# Patient Record
Sex: Female | Born: 1990 | Race: White | Hispanic: No | Marital: Married | Smoking: Never smoker
Health system: Southern US, Community
[De-identification: ages and names within clinical notes are randomized; demographics above are authoritative.]

---

## 2015-12-27 ENCOUNTER — Emergency Department
Admission: EM | Admit: 2015-12-27 | Discharge: 2015-12-27 | Disposition: A | Discharge status: Home / Self Care | Payer: PRIVATE HEALTH INSURANCE | Source: Home / Self Care | Attending: Family Medicine | Admitting: Family Medicine

## 2015-12-27 ENCOUNTER — Encounter: Payer: Self-pay | Admitting: Emergency Medicine

## 2015-12-27 DIAGNOSIS — H04301 Unspecified dacryocystitis of right lacrimal passage: Secondary | ICD-10-CM

## 2015-12-27 MED ORDER — SULFACETAMIDE SODIUM 10 % OP SOLN: 1.0000 [drp] | OPHTHALMIC | 0 refills | Status: DC

## 2015-12-27 NOTE — Discharge Instructions (Signed)
Begin applying warm compress to right eye several times daily.

## 2015-12-27 NOTE — ED Provider Notes (Signed)
[facility]    CSN: [id number] Arrival date & time: 12/27/15  1446     History   Chief Complaint Chief Complaint  Patient presents with  . Eye Drainage    HPI Tiffany Moore is a 25 y.o. female.   Patient complains of onset of right eye drainage this morning.  No foreign body sensation.    The history is provided by the patient.  Eye Problem  Location:  Right eye Quality:  Aching Severity:  Mild Onset quality:  Sudden Duration:  6 hours Timing:  Constant Progression:  Unchanged Chronicity:  New Context: not burn, not chemical exposure, not contact lens problem, not direct trauma, not foreign body, not scratch, not smoke exposure and not UV exposure   Relieved by:  Nothing Worsened by:  Nothing Ineffective treatments:  None tried Associated symptoms: crusting, discharge, inflammation, redness, tearing and tingling   Associated symptoms: no blurred vision, no decreased vision, no double vision, no facial rash, no headaches, no itching, no nausea, no photophobia, no scotomas and no swelling     History reviewed. No pertinent past medical history.  There are no active problems to display for this patient.   History reviewed. No pertinent surgical history.  OB History    No data available       Home Medications    Prior to Admission medications   Medication Sig Start Date End Date Taking? Authorizing Provider  sulfacetamide (BLEPH-10) 10 % ophthalmic solution Place 1-2 drops into the right eye every 3 (three) hours. (while awake) 12/27/15   Lattie HawStephen A Beese, MD    Family History No family history on file.  Social History Social History  Substance Use Topics  . Smoking status: Never Smoker  . Smokeless tobacco: Never Used  . Alcohol use No     Allergies   Review of patient's allergies indicates no known allergies.   Review of Systems Review of Systems  Eyes: Positive for discharge and redness. Negative for blurred vision, double  vision, photophobia and itching.  Gastrointestinal: Negative for nausea.  Neurological: Positive for tingling. Negative for headaches.  All other systems reviewed and are negative.    Physical Exam Triage Vital Signs ED Triage Vitals  Enc Vitals Group     BP 12/27/15 1518 122/79     Pulse Rate 12/27/15 1518 75     Resp --      Temp 12/27/15 1518 98.4 F (36.9 C)     Temp Source 12/27/15 1518 Oral     SpO2 12/27/15 1518 98 %     Weight 12/27/15 1519 166 lb 12 oz (75.6 kg)     Height 12/27/15 1519 5\' 6"  (1.676 m)     Head Circumference --      Peak Flow --      Pain Score 12/27/15 1520 5     Pain Loc --      Pain Edu? --      Excl. in GC? --    No data found.   Updated Vital Signs BP 122/79 (BP Location: Left Arm)   Pulse 75   Temp 98.4 F (36.9 C) (Oral)   Ht 5\' 6"  (1.676 m)   Wt 166 lb 12 oz (75.6 kg)   SpO2 98%   BMI 26.91 kg/m   Visual Acuity Right Eye Distance:   Left Eye Distance:   Bilateral Distance:    Right Eye Near:   Left Eye Near:    Bilateral Near:  Physical Exam  Constitutional: She appears well-developed and well-nourished. No distress.  HENT:  Head: Normocephalic.  Right Ear: Tympanic membrane, external ear and ear canal normal.  Left Ear: Tympanic membrane, external ear and ear canal normal.  Nose: Nose normal.  Mouth/Throat: Oropharynx is clear and moist.  Eyes: Conjunctivae, EOM and lids are normal. Pupils are equal, round, and reactive to light. Lids are everted and swept, no foreign bodies found. Right eye exhibits discharge. Right eye exhibits no hordeolum.    Right eye has mild swelling and tenderness around the nasolacrimal duct, with small amount of mucopurulent drainage present.  No photophobia.  No lid swelling or tenderness.  Neck: Neck supple.  Lymphadenopathy:    She has no cervical adenopathy.  Nursing note and vitals reviewed.    UC Treatments / Results  Labs (all labs ordered are listed, but only abnormal  results are displayed) Labs Reviewed - No data to display  EKG  EKG Interpretation None       Radiology No results found.  Procedures Procedures (including critical care time)  Medications Ordered in UC Medications - No data to display   Initial Impression / Assessment and Plan / UC Course  I have reviewed the triage vital signs and the nursing notes.  Pertinent labs & imaging results that were available during my care of the patient were reviewed by me and considered in my medical decision making (see chart for details).  Clinical Course  Begin sulfacetamide ophth suspension.  Begin applying warm compress to right eye several times daily. Followup with ophthalmologist if not improved 4 days.    Final Clinical Impressions(s) / UC Diagnoses   Final diagnoses:  Dacrocystitis, right    New Prescriptions New Prescriptions   SULFACETAMIDE (BLEPH-10) 10 % OPHTHALMIC SOLUTION    Place 1-2 drops into the right eye every 3 (three) hours. (while awake)     Lattie HawStephen A Beese, MD 01/03/16 2206

## 2015-12-27 NOTE — ED Triage Notes (Signed)
Pt c/o possible pink eye, drainage this morning, some pain.

## 2017-01-20 ENCOUNTER — Emergency Department
Admission: EM | Admit: 2017-01-20 | Discharge: 2017-01-20 | Disposition: A | Discharge status: Home / Self Care | Payer: PRIVATE HEALTH INSURANCE | Source: Home / Self Care | Attending: Family Medicine | Admitting: Family Medicine

## 2017-01-20 ENCOUNTER — Encounter: Payer: Self-pay | Admitting: Emergency Medicine

## 2017-01-20 ENCOUNTER — Other Ambulatory Visit: Payer: Self-pay

## 2017-01-20 DIAGNOSIS — J069 Acute upper respiratory infection, unspecified: Secondary | ICD-10-CM

## 2017-01-20 DIAGNOSIS — R0981 Nasal congestion: Secondary | ICD-10-CM

## 2017-01-20 DIAGNOSIS — H9203 Otalgia, bilateral: Secondary | ICD-10-CM

## 2017-01-20 LAB — POCT RAPID STREP A (OFFICE): Rapid Strep A Screen: NEGATIVE

## 2017-01-20 MED ORDER — AZITHROMYCIN 250 MG PO TABS: 250.0000 mg | ORAL_TABLET | Freq: Every day | ORAL | 0 refills | Status: DC

## 2017-01-20 MED ORDER — BENZONATATE 100 MG PO CAPS: 100.0000 mg | ORAL_CAPSULE | Freq: Three times a day (TID) | ORAL | 0 refills | Status: DC

## 2017-01-20 NOTE — Discharge Instructions (Signed)
°  Your symptoms are likely due to a virus such as the common cold, however, if you developing worsening chest congestion with shortness of breath, persistent fever (>100.4*F) for 3 days, or symptoms not improving in 4-5 days, you may fill the antibiotic (azithromycin).  If you do fill the antibiotic,  please take antibiotics as prescribed and be sure to complete entire course even if you start to feel better to ensure infection does not come back. ° °You may take 500mg acetaminophen every 4-6 hours or in combination with ibuprofen 400-600mg every 6-8 hours as needed for pain, inflammation, and fever. ° °Be sure to drink at least eight 8oz glasses of water to stay well hydrated and get at least 8 hours of sleep at night, preferably more while sick.  ° ° °

## 2017-01-20 NOTE — ED Triage Notes (Signed)
Reports sore throat, ear pain, headache and congestion over past week; denies fever. No OTCs this morning. Had frequent strep throat as child.

## 2017-01-20 NOTE — ED Provider Notes (Signed)
[facility]    CSN: [id number] Arrival date & time: 01/20/17  0836     History   Chief Complaint Chief Complaint  Patient presents with  . Sore Throat  . Otalgia  . Headache  . Nasal Congestion    HPI Tiffany Moore is a 26 y.o. female.   HPI Tiffany Moore is a 26 y.o. female presenting to UC with c/o 6-7 days of gradually worsening sore throat, bilateral ear pain, congestion, and mild cough.  She has tried OTC allergy medication w/o relief. 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  Denies fever, chills, n/v/d. She does report hx of frequent strep throat as a child. She has been able to keep down fluids.   History reviewed. No pertinent past medical history.  There are no active problems to display for this patient.   History reviewed. No pertinent surgical history.  OB History    No data available       Home Medications    Prior to Admission medications   Medication Sig Start Date End Date Taking? Authorizing Provider  levonorgestrel-ethinyl estradiol (ENPRESSE,TRIVORA) tablet Take 1 tablet by mouth daily.   Yes [provider]  azithromycin (ZITHROMAX) 250 MG tablet Take 1 tablet (250 mg total) by mouth daily. Take first 2 tablets together, then 1 every day until finished. 01/20/17   Lurene ShadowPhelps, Sophiarose Eades O, PA-C  benzonatate (TESSALON) 100 MG capsule Take 1-2 capsules (100-200 mg total) by mouth every 8 (eight) hours. 01/20/17   Lurene ShadowPhelps, Travaris Kosh O, PA-C  sulfacetamide (BLEPH-10) 10 % ophthalmic solution Place 1-2 drops into the right eye every 3 (three) hours. (while awake) 12/27/15   Lattie HawBeese, Stephen A, MD    Family History No family history on file.  Social History Social History   Tobacco Use  . Smoking status: Never Smoker  . Smokeless tobacco: Never Used  Substance Use Topics  . Alcohol use: No  . Drug use: Not on file     Allergies   Patient has no known allergies.   Review of Systems Review of  Systems  Constitutional: Negative for chills and fever.  HENT: Positive for congestion, ear pain, postnasal drip, rhinorrhea and sore throat. Negative for trouble swallowing and voice change.   Respiratory: Positive for cough. Negative for shortness of breath.   Cardiovascular: Negative for chest pain and palpitations.  Gastrointestinal: Negative for abdominal pain, diarrhea, nausea and vomiting.  Musculoskeletal: Negative for arthralgias, back pain and myalgias.  Skin: Negative for rash.  Neurological: Positive for headaches. Negative for dizziness and light-headedness.     Physical Exam Triage Vital Signs ED Triage Vitals  Enc Vitals Group     BP 01/20/17 0915 112/76     Pulse Rate 01/20/17 0915 88     Resp 01/20/17 0915 16     Temp 01/20/17 0915 98.5 F (36.9 C)     Temp Source 01/20/17 0915 Oral     SpO2 01/20/17 0915 100 %     Weight 01/20/17 0916 177 lb (80.3 kg)     Height 01/20/17 0916 5\' 6"  (1.676 m)     Head Circumference --      Peak Flow --      Pain Score --      Pain Loc --      Pain Edu? --      Excl. in GC? --    No data found.  Updated Vital Signs BP 112/76 (BP Location:  Right Arm)   Pulse 88   Temp 98.5 F (36.9 C) (Oral)   Resp 16   Ht 5\' 6"  (1.676 m)   Wt 177 lb (80.3 kg)   LMP 01/13/2017   SpO2 100%   BMI 28.57 kg/m   Visual Acuity Right Eye Distance:   Left Eye Distance:   Bilateral Distance:    Right Eye Near:   Left Eye Near:    Bilateral Near:     Physical Exam  Constitutional: She is oriented to person, place, and time. She appears well-developed and well-nourished.  Non-toxic appearance. She does not appear ill. No distress.  HENT:  Head: Normocephalic and atraumatic.  Right Ear: Tympanic membrane normal.  Left Ear: Tympanic membrane normal.  Nose: Mucosal edema present. Right sinus exhibits no maxillary sinus tenderness and no frontal sinus tenderness. Left sinus exhibits no maxillary sinus tenderness and no frontal sinus  tenderness.  Mouth/Throat: Uvula is midline and mucous membranes are normal. Posterior oropharyngeal erythema present. No oropharyngeal exudate, posterior oropharyngeal edema or tonsillar abscesses.  Eyes: EOM are normal.  Neck: Normal range of motion. Neck supple.  Cardiovascular: Normal rate and regular rhythm.  Pulmonary/Chest: Effort normal and breath sounds normal. No stridor. She has no wheezes. She has no rhonchi.  Musculoskeletal: Normal range of motion.  Lymphadenopathy:    She has no cervical adenopathy.  Neurological: She is alert and oriented to person, place, and time.  Skin: Skin is warm and dry.  Psychiatric: She has a normal mood and affect. Her behavior is normal.  Nursing note and vitals reviewed.    UC Treatments / Results  Labs (all labs ordered are listed, but only abnormal results are displayed) Labs Reviewed  POCT RAPID STREP A (OFFICE)    EKG  EKG Interpretation None       Radiology No results found.  Procedures Procedures (including critical care time)  Medications Ordered in UC Medications - No data to display   Initial Impression / Assessment and Plan / UC Course  I have reviewed the triage vital signs and the nursing notes.  Pertinent labs & imaging results that were available during my care of the patient were reviewed by me and considered in my medical decision making (see chart for details).     Hx and exam c/w viral illness Encouraged symptomatic treatment Prescription to hold with expiration date for azithromycin. Pt to fill if persistent fever develops or not improving in 1 week.    Final Clinical Impressions(s) / UC Diagnoses   Final diagnoses:  Upper respiratory tract infection, unspecified type  Acute otalgia, bilateral  Sinus congestion    ED Discharge Orders        Ordered    benzonatate (TESSALON) 100 MG capsule  Every 8 hours     01/20/17 0927    azithromycin (ZITHROMAX) 250 MG tablet  Daily    Comments:  Void  after 02/06/17   01/20/17 [id number]       Controlled Substance Prescriptions [place] Controlled Substance Registry consulted? Not Applicable   Rolla Platehelps, Anastazia [place] O, PA-C 01/20/17 [id number]

## 2017-01-22 ENCOUNTER — Telehealth: Payer: Self-pay | Admitting: Emergency Medicine

## 2017-01-22 NOTE — Telephone Encounter (Signed)
Courtesy call to patient.Patient feels well and advised to CB with questions or concerns.

## 2017-04-04 ENCOUNTER — Emergency Department
Admission: EM | Admit: 2017-04-04 | Discharge: 2017-04-04 | Disposition: A | Discharge status: Home / Self Care | Payer: PRIVATE HEALTH INSURANCE | Source: Home / Self Care | Attending: Family Medicine | Admitting: Family Medicine

## 2017-04-04 ENCOUNTER — Other Ambulatory Visit: Payer: Self-pay

## 2017-04-04 ENCOUNTER — Telehealth: Payer: Self-pay | Admitting: *Deleted

## 2017-04-04 DIAGNOSIS — J069 Acute upper respiratory infection, unspecified: Secondary | ICD-10-CM | POA: Diagnosis not present

## 2017-04-04 MED ORDER — PREDNISONE 10 MG PO TABS: 30.0000 mg | ORAL_TABLET | Freq: Every day | ORAL | 0 refills | Status: AC

## 2017-04-04 MED ORDER — PROMETHAZINE-PHENYLEPHRINE 6.25-5 MG/5ML PO SYRP: 5.0000 mL | ORAL_SOLUTION | Freq: Four times a day (QID) | ORAL | 0 refills | Status: DC | PRN

## 2017-04-04 MED ORDER — AMOXICILLIN 500 MG PO CAPS: 500.0000 mg | ORAL_CAPSULE | Freq: Three times a day (TID) | ORAL | 0 refills | Status: DC

## 2017-04-04 MED ORDER — PREDNISOLONE SODIUM PHOSPHATE 10 MG PO TBDP: 30.0000 mg | ORAL_TABLET | Freq: Every day | ORAL | 0 refills | Status: AC

## 2017-04-04 NOTE — ED Provider Notes (Signed)
[facility]    CSN: [id number] Arrival date & time: 04/04/17  0850     History   Chief Complaint Chief Complaint  Patient presents with  . Cough    HPI Litzi Binning is a 27 y.o. female.   HPI Kaniesha Barile is a 27 y.o. female presenting to UC with c/o 2 weeks worsening mildly productive cough for 2 weeks with associated SOB, chest tightness and coughing fits that have caused post-tussive vomiting twice this morning.  She is also c/o upper abdominal pain from cough.  She has taken multiple OTC cough medications w/o relief. 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 Denies fever, chills, n/v/d.  Denies hx of asthma.   History reviewed. No pertinent past medical history.  There are no active problems to display for this patient.   History reviewed. No pertinent surgical history.  OB History    No data available       Home Medications    Prior to Admission medications   Medication Sig Start Date End Date Taking? Authorizing Provider  amoxicillin (AMOXIL) 500 MG capsule Take 1 capsule (500 mg total) by mouth 3 (three) times daily. 04/04/17   Lurene Shadow, PA-C  azithromycin (ZITHROMAX) 250 MG tablet Take 1 tablet (250 mg total) by mouth daily. Take first 2 tablets together, then 1 every day until finished. 01/20/17   Lurene Shadow, PA-C  benzonatate (TESSALON) 100 MG capsule Take 1-2 capsules (100-200 mg total) by mouth every 8 (eight) hours. 01/20/17   Lurene Shadow, PA-C  levonorgestrel-ethinyl estradiol Geanie Logan) tablet Take 1 tablet by mouth daily.    [provider]  prednisoLONE (ORAPRED ODT) 10 MG disintegrating tablet Take 3 tablets (30 mg total) by mouth daily for 5 days. 04/04/17 04/09/17  Lurene Shadow, PA-C  promethazine-phenylephrine (PROMETHAZINE VC) 6.25-5 MG/5ML SYRP Take 5 mLs by mouth every 6 (six) hours as needed for congestion. 04/04/17   Lurene Shadow, PA-C  sulfacetamide (BLEPH-10) 10 % ophthalmic  solution Place 1-2 drops into the right eye every 3 (three) hours. (while awake) 12/27/15   Lattie Haw, MD    Family History History reviewed. No pertinent family history.  Social History Social History   Tobacco Use  . Smoking status: Never Smoker  . Smokeless tobacco: Never Used  Substance Use Topics  . Alcohol use: No  . Drug use: Not on file     Allergies   Patient has no known allergies.   Review of Systems Review of Systems  Constitutional: Negative for chills and fever.  HENT: Positive for congestion and postnasal drip. Negative for ear pain, sore throat, trouble swallowing and voice change.   Respiratory: Positive for cough, chest tightness and shortness of breath.   Cardiovascular: Positive for chest pain. Negative for palpitations.  Gastrointestinal: Positive for vomiting (post-tussive). Negative for abdominal pain, diarrhea and nausea.  Musculoskeletal: Negative for arthralgias, back pain and myalgias.  Skin: Negative for rash.  Neurological: Positive for headaches. Negative for dizziness and light-headedness.     Physical Exam Triage Vital Signs ED Triage Vitals  Enc Vitals Group     BP 04/04/17 0941 130/80     Pulse Rate 04/04/17 0941 91     Resp --      Temp 04/04/17 0941 98.2 F (36.8 C)     Temp Source 04/04/17 0941 Oral     SpO2 04/04/17 0941 100 %     Weight 04/04/17 0942 177  lb (80.3 kg)     Height 04/04/17 0942 5\' 6"  (1.676 m)     Head Circumference --      Peak Flow --      Pain Score 04/04/17 0942 0     Pain Loc --      Pain Edu? --      Excl. in GC? --    No data found.  Updated Vital Signs BP 130/80 (BP Location: Right Arm)   Pulse 91   Temp 98.2 F (36.8 C) (Oral)   Ht 5\' 6"  (1.676 m)   Wt 177 lb (80.3 kg)   LMP 03/08/2017   SpO2 100%   BMI 28.57 kg/m   Visual Acuity Right Eye Distance:   Left Eye Distance:   Bilateral Distance:    Right Eye Near:   Left Eye Near:    Bilateral Near:     Physical Exam    Constitutional: She is oriented to person, place, and time. She appears well-developed and well-nourished. No distress.  HENT:  Head: Normocephalic and atraumatic.  Right Ear: Tympanic membrane normal.  Left Ear: Tympanic membrane normal.  Nose: Nose normal.  Mouth/Throat: Uvula is midline, oropharynx is clear and moist and mucous membranes are normal.  Eyes: EOM are normal.  Neck: Normal range of motion.  Cardiovascular: Normal rate and regular rhythm.  Pulmonary/Chest: Effort normal. No stridor. No respiratory distress. She has wheezes (faint expiratory ). She has no rales.  Cough brought on by deep inspiration during lung exam.   Musculoskeletal: Normal range of motion.  Neurological: She is alert and oriented to person, place, and time.  Skin: Skin is warm and dry. She is not diaphoretic.  Psychiatric: She has a normal mood and affect. Her behavior is normal.  Nursing note and vitals reviewed.    UC Treatments / Results  Labs (all labs ordered are listed, but only abnormal results are displayed) Labs Reviewed - No data to display  EKG  EKG Interpretation None       Radiology No results found.  Procedures Procedures (including critical care time)  Medications Ordered in UC Medications - No data to display   Initial Impression / Assessment and Plan / UC Course  I have reviewed the triage vital signs and the nursing notes.  Pertinent labs & imaging results that were available during my care of the patient were reviewed by me and considered in my medical decision making (see chart for details).     Will cover for underlying bacterial infection given duration of worsening symptoms. Pt reports GI upset with azithromycin but has done well with amoxicillin F/u with PCP in 1 week if not improving.   Final Clinical Impressions(s) / UC Diagnoses   Final diagnoses:  Upper respiratory tract infection, unspecified type    ED Discharge Orders        Ordered     prednisoLONE (ORAPRED ODT) 10 MG disintegrating tablet  Daily     04/04/17 0948    amoxicillin (AMOXIL) 500 MG capsule  3 times daily     04/04/17 0948    promethazine-phenylephrine (PROMETHAZINE VC) 6.25-5 MG/5ML SYRP  Every 6 hours PRN     04/04/17 0948       Controlled Substance Prescriptions [place] Controlled Substance Registry consulted? Not Applicable   Rolla Platehelps, Daviyon Widmayer O, PA-C 04/04/17 [id number]

## 2017-04-04 NOTE — ED Triage Notes (Signed)
Cough over 2 weeks, SOB, coughing fits that caused vomiting this morning.  Having upper abd. Pain this am from coughing, painful to take a deep breath

## 2017-04-04 NOTE — Discharge Instructions (Signed)
°  Promethazine-codeine is a strong narcotic cough medication.  Be sure to take with large glass of water.  It may cause drowsiness. Do not drive, drink alcohol or take other sedating medications such as Nyquil while taking this medication.  This medication may cause stomach upset, it is best to take with a meal or small snack.  You may take 500mg  acetaminophen every 4-6 hours or in combination with ibuprofen 400-600mg  every 6-8 hours as needed for pain, inflammation, and fever.  Be sure to drink at least eight 8oz glasses of water to stay well hydrated and get at least 8 hours of sleep at night, preferably more while sick.   Please take antibiotics as prescribed and be sure to complete entire course even if you start to feel better to ensure infection does not come back.

## 2017-05-11 ENCOUNTER — Encounter: Payer: Self-pay | Admitting: *Deleted

## 2017-05-11 ENCOUNTER — Other Ambulatory Visit: Payer: Self-pay

## 2017-05-11 ENCOUNTER — Emergency Department
Admission: EM | Admit: 2017-05-11 | Discharge: 2017-05-11 | Disposition: A | Discharge status: Home / Self Care | Payer: PRIVATE HEALTH INSURANCE | Source: Home / Self Care | Attending: Emergency Medicine | Admitting: Emergency Medicine

## 2017-05-11 DIAGNOSIS — J0101 Acute recurrent maxillary sinusitis: Secondary | ICD-10-CM

## 2017-05-11 DIAGNOSIS — R112 Nausea with vomiting, unspecified: Secondary | ICD-10-CM | POA: Diagnosis not present

## 2017-05-11 DIAGNOSIS — B349 Viral infection, unspecified: Secondary | ICD-10-CM

## 2017-05-11 DIAGNOSIS — R5383 Other fatigue: Secondary | ICD-10-CM | POA: Diagnosis not present

## 2017-05-11 DIAGNOSIS — B2780 Other infectious mononucleosis without complication: Secondary | ICD-10-CM

## 2017-05-11 LAB — POCT MONO SCREEN (KUC): Mono, POC: NEGATIVE

## 2017-05-11 MED ORDER — CEFDINIR 300 MG PO CAPS: 300.0000 mg | ORAL_CAPSULE | Freq: Two times a day (BID) | ORAL | 0 refills | Status: DC

## 2017-05-11 MED ORDER — ONDANSETRON 4 MG PO TBDP: 4.0000 mg | ORAL_TABLET | Freq: Three times a day (TID) | ORAL | 1 refills | Status: DC | PRN

## 2017-05-11 MED ORDER — PREDNISONE 10 MG PO TABS: ORAL_TABLET | ORAL | 0 refills | Status: DC

## 2017-05-11 NOTE — ED Triage Notes (Signed)
Patient c/o 2 days of right sided nasal congestion, ear pain and throat "ticking" the throat tickle will trigger a cough followed by vomiting. Reports her sister was recently diagnosed with mono, she would like to be tested.

## 2017-05-11 NOTE — ED Provider Notes (Signed)
[facility]    CSN: [id number] Arrival date & time: 05/11/17  1525     History   Chief Complaint Chief Complaint  Patient presents with  . Otalgia  . Nasal Congestion    HPI Tiffany Moore is a 27 y.o. female.   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 [place] about 2 years ago and is not yet established with PCP.  Complicated history. Complains of fatigue since Thanksgiving, although it has improved somewhat and waxed and waned, it never completely resolved. Seen here 01/20/17, diagnosed with URI, treated with Z-Pak.  Improved somewhat, but not completely and some symptoms recurred. Seen here 04/04/2017, strep test was negative, diagnosed with bacterial URI/inflammation and was prescribed: Prednisolone, amoxicillin, and short-term Promethazine VC cough syrup.  After 10 days, she improved moderately but not completely and now symptoms have recurred.                Although she improved somewhat by mid February and energy improved somewhat, she never felt 100% better and now symptoms have recurred and worsened.  Especially pressure feeling right ear, right maxillary sinus pain and congestion and worsening yellow-green rhinorrhea for at least 3 weeks. Has only minimal irritation of throat but no real sore throat. Complains of nausea, sometimes hacking cough, nonproductive Has a tickle cough frequently, which can induce vomiting once or twice a night for the past week. She denies chance of pregnancy, LMP normal 05/01/2017 Complains of occasional mild left upper quadrant pain, not related to eating.  May have felt feverish but no definite fever or chills or sweats Sister was diagnosed with mono last week and she requests testing for this. She states she was diagnosed with mononucleosis about 5 years in college and those symptoms resolved after about a month. History reviewed. No pertinent past medical  history. Seasonal sinus allergies, takes Zyrtec as needed. Past medical history otherwise negative for chronic disease There are no active problems to display for this patient.   History reviewed. No pertinent surgical history. No surgeries OB History    No data available    Denies chance of pregnancy, LMP regular, LMP 05/01/2017. Non-smoker   Home Medications    Prior to Admission medications   Medication Sig Start Date End Date Taking? Authorizing Provider  cetirizine (ZYRTEC) 10 MG tablet Take 10 mg by mouth daily.   Yes [provider]  Norgestim-Eth Estrad Triphasic (TRI-SPRINTEC PO) Take by mouth.   Yes [provider]  cefdinir (OMNICEF) 300 MG capsule Take 1 capsule (300 mg total) by mouth 2 (two) times daily. X 10 days 05/11/17   Lajean ManesMassey, David, MD  ondansetron (ZOFRAN-ODT) 4 MG disintegrating tablet Take 1 tablet (4 mg total) by mouth every 8 (eight) hours as needed for nausea or vomiting. 05/11/17   Lajean ManesMassey, David, MD  predniSONE (DELTASONE) 10 MG tablet Days 1 & 2, take 6 daily. Days 3 & 4, take 5 daily. Days 5 & 6: 4 daily. Days 7 &8: 3 daily. Days 9 &10: 2 daily. Days 11 &12: 1 daily. 05/11/17   Lajean ManesMassey, David, MD    Family History History reviewed. No pertinent family history.  Social History Social History   Tobacco Use  . Smoking status: Never Smoker  . Smokeless tobacco: Never Used  Substance Use Topics  . Alcohol use: No  . Drug use: Not on file     Allergies   Patient has no known allergies.  Review of Systems Review of Systems  Constitutional: Positive for fatigue and fever (Perhaps low-grade, not documented). Negative for chills and diaphoresis.  HENT: Positive for congestion, ear pain, rhinorrhea, sinus pressure, sinus pain and sneezing. Negative for sore throat, tinnitus and voice change.   Eyes: Negative for discharge.  Respiratory: Positive for cough. Negative for chest tightness, shortness of breath and wheezing.    Cardiovascular: Negative for chest pain, palpitations and leg swelling.  Gastrointestinal: Positive for abdominal pain (Mild left upper quadrant at times), nausea and vomiting. Negative for blood in stool and diarrhea.  Genitourinary: Negative.   Musculoskeletal: Negative for myalgias.  Skin: Negative for rash.  Neurological: Positive for weakness (Generalized fatigue, no focal weakness). Negative for dizziness, tremors, light-headedness and numbness.  Hematological: Positive for adenopathy (Mild swollen anterior neck glands.  No other adenopathy known).  Psychiatric/Behavioral: Negative for confusion, dysphoric mood, hallucinations and suicidal ideas.  All other systems reviewed and are negative.  See also HPI  Physical Exam Triage Vital Signs ED Triage Vitals  Enc Vitals Group     BP 05/11/17 1558 136/89     Pulse Rate 05/11/17 1558 93     Resp 05/11/17 1558 14     Temp 05/11/17 1558 98.3 F (36.8 C)     Temp Source 05/11/17 1558 Oral     SpO2 05/11/17 1558 99 %     Weight 05/11/17 1559 178 lb (80.7 kg)     Height --      Head Circumference --      Peak Flow --      Pain Score 05/11/17 1559 3     Pain Loc --      Pain Edu? --      Excl. in GC? --    No data found.  Updated Vital Signs BP 136/89 (BP Location: Right Arm)   Pulse 93   Temp 98.3 F (36.8 C) (Oral)   Resp 14   Wt 178 lb (80.7 kg)   LMP 05/01/2017   SpO2 99%   BMI 28.73 kg/m   Visual Acuity Right Eye Distance:   Left Eye Distance:   Bilateral Distance:    Right Eye Near:   Left Eye Near:    Bilateral Near:     Physical Exam  Constitutional: She is oriented to person, place, and time. She appears well-developed and well-nourished. No distress.  Appears fatigued, but not toxic.  No acute distress. She is alert and cooperative  HENT:  Head: Normocephalic and atraumatic.  Right Ear: Tympanic membrane, external ear and ear canal normal.  Left Ear: Tympanic membrane, external ear and ear canal  normal.  Nose: Mucosal edema and rhinorrhea present. Right sinus exhibits maxillary sinus tenderness. Left sinus exhibits no maxillary sinus tenderness.  Mouth/Throat: Oropharynx is clear and moist. No oral lesions. No oropharyngeal exudate.  Nose: Boggy turbinates, seromucoid drainage bilaterally especially on right. There is right maxillary sinus tenderness to palpation  Eyes: Right eye exhibits no discharge. Left eye exhibits no discharge. No scleral icterus.  Neck: Neck supple.  Tender bilateral shotty mobile anterior cervical nodes bilaterally. Shotty mildly tender bilateral posterior cervical nodes.  Cardiovascular: Normal rate, regular rhythm and normal heart sounds.  Pulmonary/Chest: Effort normal and breath sounds normal. She has no wheezes. She has no rales.  Abdominal: Soft. Normal appearance and bowel sounds are normal. She exhibits no distension.  Mild tenderness left upper quadrant.  No definite spleen tip felt. Otherwise abdomen nontender. No definite organomegaly or masses. No  definite guarding, rigidity, rebound, or CVA tenderness  Lymphadenopathy:  See neck exam for cervical adenopathy  Neurological: She is alert and oriented to person, place, and time. No cranial nerve deficit.  Skin: Skin is warm and dry. No rash noted. She is not diaphoretic.  Nursing note and vitals reviewed.    UC Treatments / Results  Labs (all labs ordered are listed, but only abnormal results are displayed) Labs Reviewed  COMPLETE METABOLIC PANEL WITH GFR  TSH  CBC  EPSTEIN-BARR VIRUS VCA, IGM  EPSTEIN-BARR VIRUS NUCLEAR ANTIGEN ANTIBODY, IGG  EPSTEIN-BARR VIRUS EARLY D ANTIGEN ANTIBODY, IGG  EPSTEIN-BARR VIRUS VCA, IGG  POCT MONO SCREEN ([facility])    EKG  EKG Interpretation None       Radiology No results found.  Procedures Procedures (including critical care time)  Medications Ordered in UC Medications - No data to display   Initial Impression / Assessment and Plan / UC  Course  I have reviewed the triage vital signs and the nursing notes.  Pertinent labs & imaging results that were available during my care of the patient were reviewed by me and considered in my medical decision making (see chart for details).     Quick Monospot test negative.  However, this might be a false negative. Clinically, patient has fatigue since late November that has not resolved. After risks, benefits, alternatives discussed, she agrees with the following plans: She has acute maxillary sinusitis, so we will treat with Omnicef twice daily for 10 days. Unexplained nausea.  Blood workup as below.  Zofran prescribed as needed. She may have an inflammatory/allergic component to her sinusitis and coughing spells, so 12-day 10 mg prednisone Dosepak prescribed. Various blood tests drawn including CBC CMP TSH. Uncertain if she has infectious mononucleosis, but in my opinion she has a mono-like syndrome.  And her sister was recently diagnosed with mono.-So, she agrees to send off various antibody tests to further define if this is CMV or EBV mononucleosis. Advise rest, pushing fluids.. No work for 4 days. Then, maximum of 8 hours working per day (she usually works 12-hour shifts)  Follow-up with your primary care doctor in 7-10 days if not improving, or sooner if symptoms become worse. Precautions discussed. Red flags discussed. Questions invited and answered. Patient voiced understanding and agreement.   Final Clinical Impressions(s) / UC Diagnoses   Final diagnoses:  Other fatigue  Acute recurrent maxillary sinusitis  Non-intractable vomiting with nausea, unspecified vomiting type  Infectious mononucleosis-like syndrome    ED Discharge Orders        Ordered    cefdinir (OMNICEF) 300 MG capsule  2 times daily     05/11/17 1700    predniSONE (DELTASONE) 10 MG tablet     05/11/17 1700    ondansetron (ZOFRAN-ODT) 4 MG disintegrating tablet  Every 8 hours PRN     05/11/17  1700     An After Visit Summary was printed and given to the patient. Quick Monospot test here was negative.  However, this can be a false negative. I am sending off to reference lab: Antibody blood tests for Epstein-Barr virus and cytomegalovirus, which are the major causes for mononucleosis, to help make a definitive diagnosis. Other blood tests pending: Complete blood count, complete metabolic panel, which includes liver tests.  Thyroid test to evaluate fatigue. You also have a sinus infection, I am prescribing Omnicef, twice a day for 10 days. Also prescribing prednisone. I am prescribing Zofran if needed for nausea. You need to  rest at home for the next 3 days. May return to work on Monday, but no working over 8 hours/day next week. If not completely better within 10 days, follow-up with PCP.    Lajean Manes, MD 05/11/17 [id number]

## 2017-05-11 NOTE — Discharge Instructions (Signed)
Quick Monospot test here was negative.  However, this can be a false negative. I am sending off to reference lab: Antibody blood tests for Epstein-Barr virus and cytomegalovirus, which are the major causes for mononucleosis, to help make a definitive diagnosis. Other blood tests pending: Complete blood count, complete metabolic panel, which includes liver tests.  Thyroid test to evaluate fatigue. You also have a sinus infection, I am prescribing Omnicef, twice a day for 10 days. Also prescribing prednisone. I am prescribing Zofran if needed for nausea. You need to rest at home for the next 3 days. May return to work on Monday, but no working over 8 hours/day next week. If not completely better within 10 days, follow-up with PCP.

## 2017-05-12 ENCOUNTER — Telehealth: Payer: Self-pay | Admitting: Emergency Medicine

## 2017-05-12 LAB — COMPLETE METABOLIC PANEL WITH GFR
AG Ratio: 1.4 (calc) (ref 1.0–2.5)
ALT: 22 U/L (ref 6–29)
AST: 23 U/L (ref 10–30)
Albumin: 4.1 g/dL (ref 3.6–5.1)
Alkaline phosphatase (APISO): 61 U/L (ref 33–115)
BUN: 10 mg/dL (ref 7–25)
CO2: 23 mmol/L (ref 20–32)
Calcium: 9.1 mg/dL (ref 8.6–10.2)
Chloride: 103 mmol/L (ref 98–110)
Creat: 0.6 mg/dL (ref 0.50–1.10)
GFR, Est African American: 148 mL/min/{1.73_m2} (ref >=60)
GFR, Est Non African American: 128 mL/min/{1.73_m2} (ref >=60)
Globulin: 2.9 g/dL (calc) (ref 1.9–3.7)
Glucose, Bld: 73 mg/dL (ref 65–99)
Potassium: 4 mmol/L (ref 3.5–5.3)
Sodium: 138 mmol/L (ref 135–146)
Total Bilirubin: 0.3 mg/dL (ref 0.2–1.2)
Total Protein: 7 g/dL (ref 6.1–8.1)

## 2017-05-12 LAB — CBC
HCT: 34.2 % — ABNORMAL LOW (ref 35.0–45.0)
Hemoglobin: 11.7 g/dL (ref 11.7–15.5)
MCH: 28.8 pg (ref 27.0–33.0)
MCHC: 34.2 g/dL (ref 32.0–36.0)
MCV: 84.2 fL (ref 80.0–100.0)
MPV: 10.9 fL (ref 7.5–12.5)
Platelets: 349 10*3/uL (ref 140–400)
RBC: 4.06 10*6/uL (ref 3.80–5.10)
RDW: 12.2 % (ref 11.0–15.0)
WBC: 7 10*3/uL (ref 3.8–10.8)

## 2017-05-12 LAB — EPSTEIN-BARR VIRUS VCA, IGM: EBV VCA IgM: <36 U/mL

## 2017-05-12 LAB — EPSTEIN-BARR VIRUS VCA, IGG: EBV VCA IgG: <18 U/mL

## 2017-05-12 LAB — EPSTEIN-BARR VIRUS NUCLEAR ANTIGEN ANTIBODY, IGG: EBV NA IgG: <18 U/mL

## 2017-05-12 LAB — EPSTEIN-BARR VIRUS EARLY D ANTIGEN ANTIBODY, IGG: EBV EA IgG: <9 U/mL

## 2017-05-12 LAB — TSH: TSH: 2.45 mIU/L

## 2017-09-16 ENCOUNTER — Emergency Department (INDEPENDENT_AMBULATORY_CARE_PROVIDER_SITE_OTHER): Payer: PRIVATE HEALTH INSURANCE

## 2017-09-16 ENCOUNTER — Other Ambulatory Visit: Payer: Self-pay

## 2017-09-16 ENCOUNTER — Encounter: Payer: Self-pay | Admitting: Emergency Medicine

## 2017-09-16 ENCOUNTER — Emergency Department (INDEPENDENT_AMBULATORY_CARE_PROVIDER_SITE_OTHER)
Admission: EM | Admit: 2017-09-16 | Discharge: 2017-09-16 | Disposition: A | Discharge status: Home / Self Care | Payer: PRIVATE HEALTH INSURANCE | Source: Home / Self Care

## 2017-09-16 DIAGNOSIS — M79671 Pain in right foot: Secondary | ICD-10-CM | POA: Diagnosis not present

## 2017-09-16 DIAGNOSIS — S93601A Unspecified sprain of right foot, initial encounter: Secondary | ICD-10-CM | POA: Diagnosis not present

## 2017-09-16 MED ORDER — PREDNISONE 20 MG PO TABS: 40.0000 mg | ORAL_TABLET | Freq: Every day | ORAL | 0 refills | Status: AC

## 2017-09-16 MED ORDER — TRAMADOL HCL 50 MG PO TABS: 50.0000 mg | ORAL_TABLET | Freq: Four times a day (QID) | ORAL | 0 refills | Status: AC | PRN

## 2017-09-16 NOTE — Discharge Instructions (Signed)
I recommend weight-bearing as tolerated. Continue ice applications for 20 minutes increments as needed to reduce inflammation. I am prescribing prednisone 40 mg for 5 days to reduce inflammation. For acute pain, tramadol 50-100 mg every 6 hours. If no improvement within 72 hours, schedule an appointment with [facility] to be further evaluated by Dr. Benjamin Stainhekkekandam.

## 2017-09-16 NOTE — ED Provider Notes (Addendum)
Tiffany Moore    CSN: [id number] Arrival date & time: 09/16/17  1217     History   Chief Complaint Chief Complaint  Patient presents with  . Foot Pain   HPI Tiffany Moore is a 27 y.o. female presents for evaluation of worsening right foot after her 100 lb dog stepped on her foot 4 days ago. Reports gradually worsening of right foot pain most notably at the lateral cuneiform and cuboid of the right foot. She never experienced swelling or bruising at the site of injury. She has a attempted relief with ice applications without improvement of pain. Pain is worsen with weight bearing and ambulation. Pain is also exacerbated by wearing shoes that extend above the metatarsal bones of the foot. No prior history of injury to right foot. Home Medications    Prior to Admission medications   Medication Sig Start Date End Date Taking? Authorizing Provider  cetirizine (ZYRTEC) 10 MG tablet Take 10 mg by mouth daily.    [provider]  Norgestim-Eth Estrad Triphasic (TRI-SPRINTEC PO) Take by mouth.    [provider]    Family History No family history on file.  Social History Social History   Tobacco Use  . Smoking status: Never Smoker  . Smokeless tobacco: Never Used  Substance Use Topics  . Alcohol use: No  . Drug use: Not on file     Allergies   Patient has no known allergies.   Review of Systems Review of Systems Pertinent negatives listed in HPI  Physical Exam Triage Vital Signs ED Triage Vitals  Enc Vitals Group     BP 09/16/17 1230 111/72     Pulse Rate 09/16/17 1230 97     Resp 09/16/17 1230 16     Temp 09/16/17 1230 98.6 F (37 C)     Temp Source 09/16/17 1230 Oral     SpO2 09/16/17 1230 100 %     Weight 09/16/17 1232 170 lb (77.1 kg)     Height 09/16/17 1232 5\' 6"  (1.676 m)     Head Circumference --      Peak Flow --      Pain Score 09/16/17 1231 2     Pain Loc --      Pain Edu? --      Excl. in GC? --    No data  found.  Updated Vital Signs BP 111/72 (BP Location: Right Arm)   Pulse 97   Temp 98.6 F (37 C) (Oral)   Resp 16   Ht 5\' 6"  (1.676 m)   Wt 170 lb (77.1 kg)   LMP 08/26/2017 (Exact Date)   SpO2 100%   BMI 27.44 kg/m   Visual Acuity Right Eye Distance:   Left Eye Distance:   Bilateral Distance:    Right Eye Near:   Left Eye Near:    Bilateral Near:     Physical Exam  Cardiovascular: Normal rate, regular rhythm and normal heart sounds.  Pulmonary/Chest: Effort normal and breath sounds normal.  Musculoskeletal:       Right foot: There is decreased range of motion, tenderness and bony tenderness. There is no swelling.       Feet:  Neurological: She is alert.  Skin: Skin is warm and dry.  Psychiatric: She has a normal mood and affect. Her behavior is normal. Judgment and thought content normal.   UC Treatments / Results  Labs (all labs ordered are listed, but only abnormal results are displayed) Labs Reviewed -  No data to display  EKG None  Radiology Dg Foot Complete Right  Result Date: 09/16/2017 CLINICAL DATA:  27 year-old female states that her 100 lb dog stepped on her RIGHT foot x 4 days ago. C/O lateral metatarsal pain EXAM: RIGHT FOOT COMPLETE - 3+ VIEW COMPARISON:  None. FINDINGS: There is no evidence of fracture or dislocation. There is no evidence of arthropathy or other focal bone abnormality. Soft tissues are unremarkable. IMPRESSION: No fracture or dislocation. Electronically Signed   By: Genevive Bi M.D.   On: 09/16/2017 13:14    Procedures Procedures (including critical Moore time)  Medications Ordered in UC Medications - No data to display  Initial Impression / Assessment and Plan / UC Course  I have reviewed the triage vital signs and the nursing notes.  Pertinent labs & imaging results that were available during my Moore of the patient were reviewed by me and considered in my medical decision making (see chart for details).  Patient presents  today with a complaint of right foot pain s/p large dog stepping on foot. Pain has gradually worsened over the course of 4 days. Imaging of right foot negative for fracture or dislocation. Apply ace-wrap to right foot and post op shoe with crutches. Continue RICE. Prednisone 40 mg x 5 days. Tramadol 50-100 mg every 6 hours as needed for pain. Recommended to bear weight with right foot as tolerated. Strict follow-up instructions provided. Patient verbalized understanding. Final Clinical Impressions(s) / UC Diagnoses   Final diagnoses:  Injury of right foot, initial encounter     Discharge Instructions     I recommend weight-bearing as tolerated. Continue ice applications for 20 minutes increments as needed to reduce inflammation. I am prescribing prednisone 40 mg for 5 days to reduce inflammation. For acute pain, tramadol 50-100 mg every 6 hours. If no improvement within 72 hours, schedule an appointment with [facility] and [facility] Medicine to be further evaluated by Dr. Benjamin Stain.    ED Prescriptions    Medication Sig Dispense Auth. Provider   traMADol (ULTRAM) 50 MG tablet Take 1-2 tablets (50-100 mg total) by mouth every 6 (six) hours as needed. 20 tablet Bing Neighbors, FNP   predniSONE (DELTASONE) 20 MG tablet Take 2 tablets (40 mg total) by mouth daily with breakfast for 5 days. 10 tablet Bing Neighbors, FNP     Controlled Substance Prescriptions [place] Controlled Substance Registry consulted? Yes, I have consulted the [place] Controlled Substances Registry for this patient, and feel the risk/benefit ratio today is favorable for proceeding with this prescription for a controlled substance.   Bing Neighbors, FNP 09/16/17 1343    Bing Neighbors, FNP 09/16/17 1344

## 2017-09-16 NOTE — ED Triage Notes (Signed)
Patient reports her 100 lb dog stepped on the top of her right foot 3 days ago; it has remained sore with weight bearing and very sensitive to touch/pressure.

## 2018-04-20 ENCOUNTER — Other Ambulatory Visit: Payer: Self-pay

## 2018-04-20 ENCOUNTER — Emergency Department (INDEPENDENT_AMBULATORY_CARE_PROVIDER_SITE_OTHER)
Admission: EM | Admit: 2018-04-20 | Discharge: 2018-04-20 | Disposition: A | Discharge status: Home / Self Care | Payer: PRIVATE HEALTH INSURANCE | Source: Home / Self Care | Attending: Family Medicine | Admitting: Family Medicine

## 2018-04-20 DIAGNOSIS — J029 Acute pharyngitis, unspecified: Secondary | ICD-10-CM | POA: Diagnosis not present

## 2018-04-20 LAB — POCT RAPID STREP A (OFFICE): Rapid Strep A Screen: NEGATIVE

## 2018-04-20 NOTE — Discharge Instructions (Signed)
  You may take 500mg acetaminophen every 4-6 hours or in combination with ibuprofen 400-600mg every 6-8 hours as needed for pain, inflammation, and fever.  Be sure to well hydrated with clear liquids and get at least 8 hours of sleep at night, preferably more while sick.   Please follow up with family medicine in 1 week if needed.   

## 2018-04-20 NOTE — ED Triage Notes (Signed)
Pt c/o sore throat that started Monday. Taking sudafed prn. Also c/o some ear pain and ringing of LT ear. Hx of frequent strep as child.

## 2018-04-20 NOTE — ED Provider Notes (Signed)
[facility]    CSN: [id number] Arrival date & time: 04/20/18  0859     History   Chief Complaint Chief Complaint  Patient presents with  . Sore Throat    HPI Tiffany Moore is a 28 y.o. female.   HPI Tiffany Moore is a 28 y.o. female presenting to UC with c/o sore throat that started 5 days ago.  Mild ear pain and ringing in the Left ear.  She has taken Sudafed with mild relief. She had strep throat frequently as a child. 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 Denies fever, chills, n/v/d. Minimal cough.   History reviewed. No pertinent past medical history.  There are no active problems to display for this patient.   History reviewed. No pertinent surgical history.  OB History   No obstetric history on file.      Home Medications    Prior to Admission medications   Medication Sig Start Date End Date Taking? Authorizing Provider  cetirizine (ZYRTEC) 10 MG tablet Take 10 mg by mouth daily.    [provider]  Norgestim-Eth Estrad Triphasic (TRI-SPRINTEC PO) Take by mouth.    [provider]  traMADol (ULTRAM) 50 MG tablet Take 1-2 tablets (50-100 mg total) by mouth every 6 (six) hours as needed. 09/16/17   Bing Neighbors, FNP    Family History History reviewed. No pertinent family history.  Social History Social History   Tobacco Use  . Smoking status: Never Smoker  . Smokeless tobacco: Never Used  Substance Use Topics  . Alcohol use: No  . Drug use: Not on file     Allergies   Patient has no known allergies.   Review of Systems Review of Systems  Constitutional: Negative for chills, fatigue and fever.  HENT: Positive for ear pain, sore throat and tinnitus. Negative for congestion, trouble swallowing and voice change.   Respiratory: Positive for cough. Negative for shortness of breath.   Gastrointestinal: Negative for diarrhea, nausea and vomiting.  Musculoskeletal: Negative for neck pain and  neck stiffness.  Skin: Negative for rash.  Neurological: Positive for headaches. Negative for dizziness and light-headedness.     Physical Exam Triage Vital Signs ED Triage Vitals [04/20/18 0917]  Enc Vitals Group     BP 131/84     Pulse Rate 99     Resp 18     Temp 97.7 F (36.5 C)     Temp Source Oral     SpO2 100 %     Weight 185 lb (83.9 kg)     Height 5\' 6"  (1.676 m)     Head Circumference      Peak Flow      Pain Score 0     Pain Loc      Pain Edu?      Excl. in GC?    No data found.  Updated Vital Signs BP 131/84 (BP Location: Right Arm)   Pulse 99   Temp 97.7 F (36.5 C) (Oral)   Resp 18   Ht 5\' 6"  (1.676 m)   Wt 185 lb (83.9 kg)   LMP 04/06/2018 (Approximate)   SpO2 100%   BMI 29.86 kg/m   Visual Acuity Right Eye Distance:   Left Eye Distance:   Bilateral Distance:    Right Eye Near:   Left Eye Near:    Bilateral Near:     Physical Exam Vitals signs and nursing note reviewed.  Constitutional:  Appearance: She is well-developed.  HENT:     Head: Normocephalic and atraumatic.     Right Ear: Tympanic membrane normal.     Left Ear: Tympanic membrane normal.     Nose: Nose normal.     Right Sinus: No maxillary sinus tenderness or frontal sinus tenderness.     Left Sinus: No maxillary sinus tenderness or frontal sinus tenderness.     Mouth/Throat:     Lips: Pink.     Mouth: Mucous membranes are moist.     Pharynx: Uvula midline. Pharyngeal swelling and posterior oropharyngeal erythema present. No oropharyngeal exudate or uvula swelling.  Neck:     Musculoskeletal: Normal range of motion and neck supple.  Cardiovascular:     Rate and Rhythm: Normal rate and regular rhythm.  Pulmonary:     Effort: Pulmonary effort is normal.  Musculoskeletal: Normal range of motion.  Lymphadenopathy:     Cervical: No cervical adenopathy.  Skin:    General: Skin is warm and dry.  Neurological:     Mental Status: She is alert and oriented to person,  place, and time.  Psychiatric:        Behavior: Behavior normal.      UC Treatments / Results  Labs (all labs ordered are listed, but only abnormal results are displayed) Labs Reviewed  STREP A DNA PROBE  POCT RAPID STREP A (OFFICE)    EKG None  Radiology No results found.  Procedures Procedures (including critical care time)  Medications Ordered in UC Medications - No data to display  Initial Impression / Assessment and Plan / UC Course  I have reviewed the triage vital signs and the nursing notes.  Pertinent labs & imaging results that were available during my care of the patient were reviewed by me and considered in my medical decision making (see chart for details).     Rapid strep: NEGATIVE Culture sent  Final Clinical Impressions(s) / UC Diagnoses   Final diagnoses:  Acute pharyngitis, unspecified etiology     Discharge Instructions      You may take 500mg  acetaminophen every 4-6 hours or in combination with ibuprofen 400-600mg  every 6-8 hours as needed for pain, inflammation, and fever.  Be sure to well hydrated with clear liquids and get at least 8 hours of sleep at night, preferably more while sick.   Please follow up with family medicine in 1 week if needed.     ED Prescriptions    None     Controlled Substance Prescriptions [place] Controlled Substance Registry consulted? Not Applicable   Rolla Plate 04/20/18 9509

## 2018-04-21 LAB — STREP A DNA PROBE: Group A Strep Probe: NOT DETECTED

## 2018-04-22 ENCOUNTER — Telehealth: Payer: Self-pay | Admitting: Emergency Medicine

## 2018-04-22 NOTE — Telephone Encounter (Signed)
Patient informed of throat culture results that were negative.  Patient states that she is doing better and will follow up as needed.

## 2018-04-22 NOTE — Telephone Encounter (Signed)
LMTRC

## 2018-07-27 IMAGING — DX DG FOOT COMPLETE 3+V*R*
3 series · 3 of 3 positions shown · non-contrast
Comparison: None.

CLINICAL DATA: 27 year-old female states that her 100 lb dog
stepped on her RIGHT foot x 4 days ago. C/O lateral metatarsal pain

EXAM:
RIGHT FOOT COMPLETE - 3+ VIEW

[foot ap]
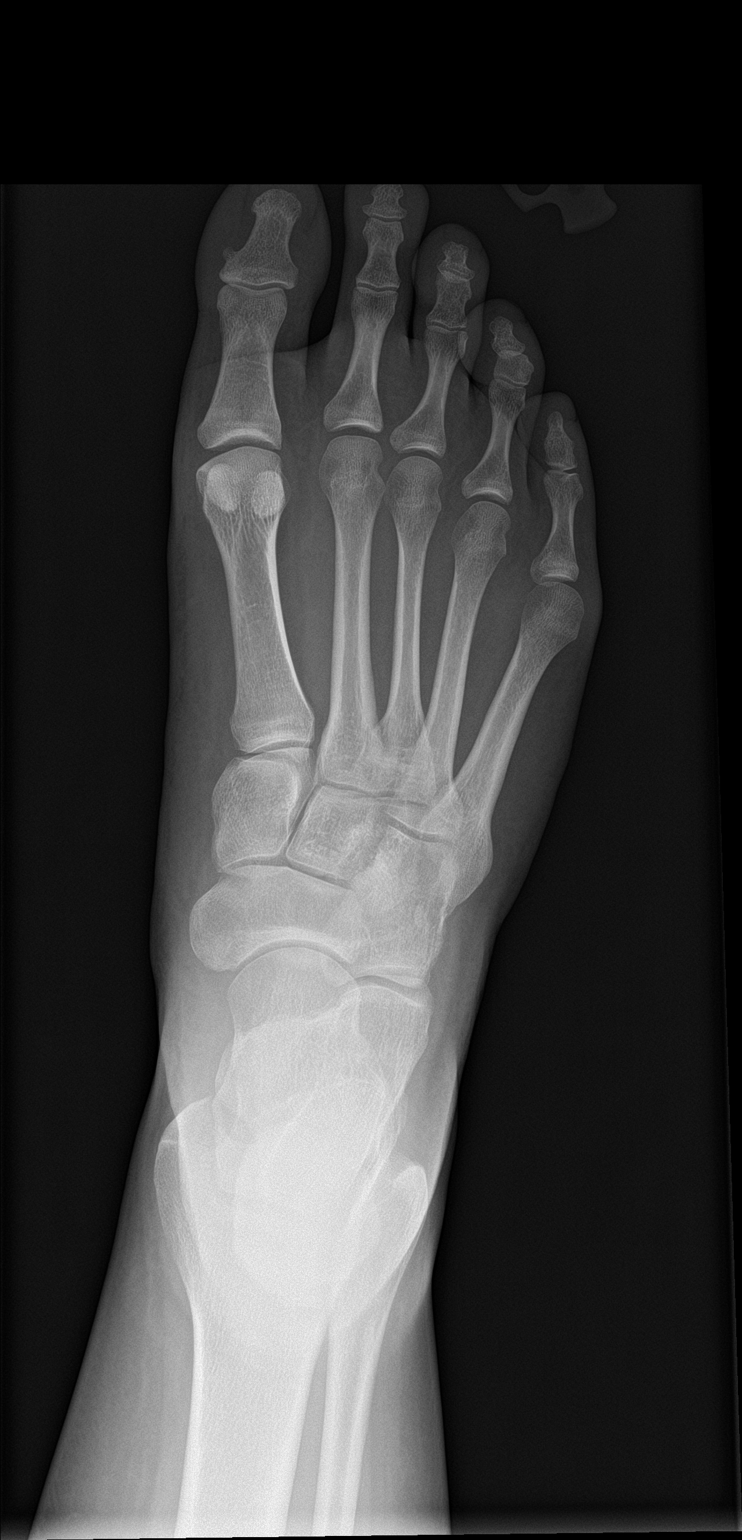

[foot obl]
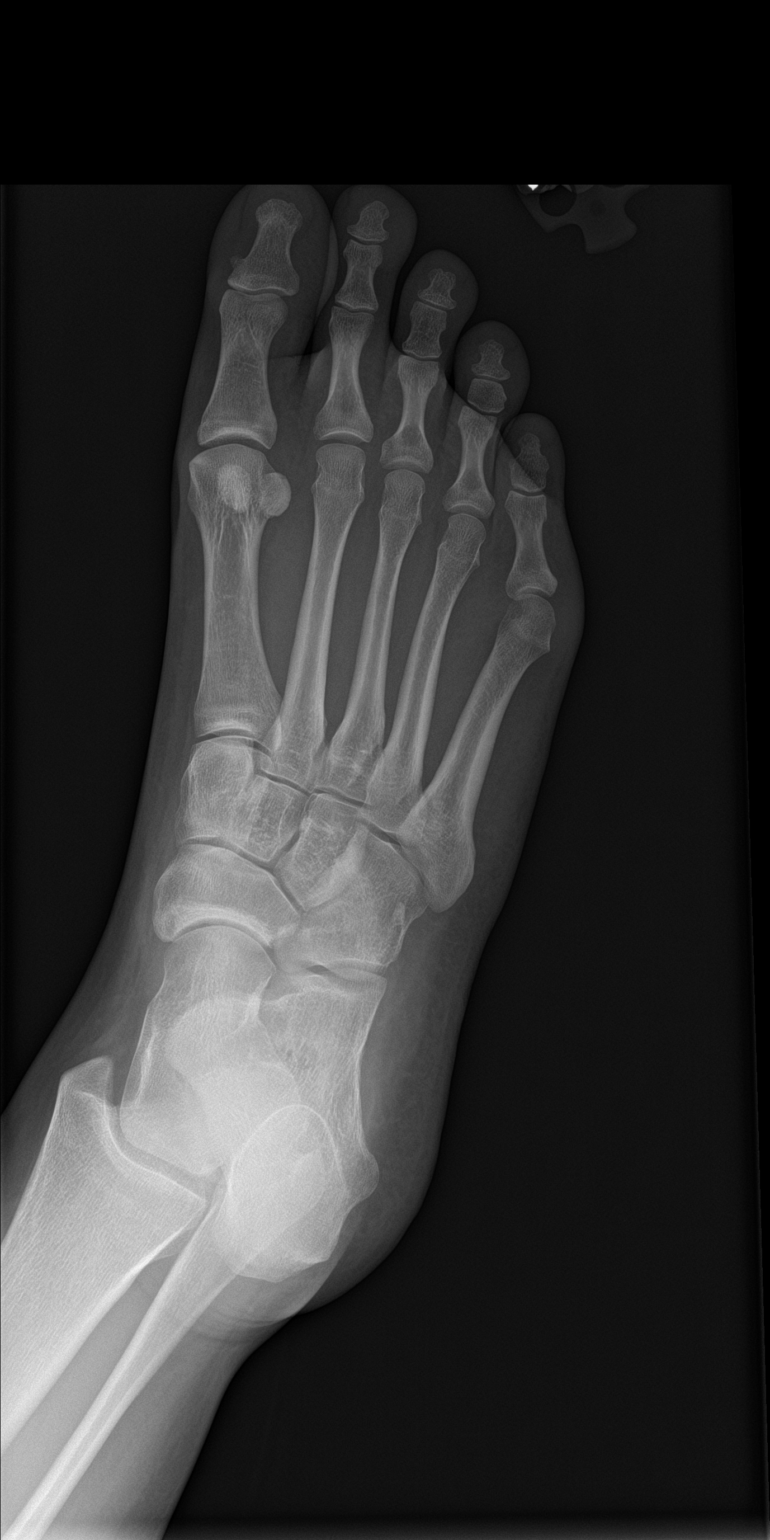

[foot lat]
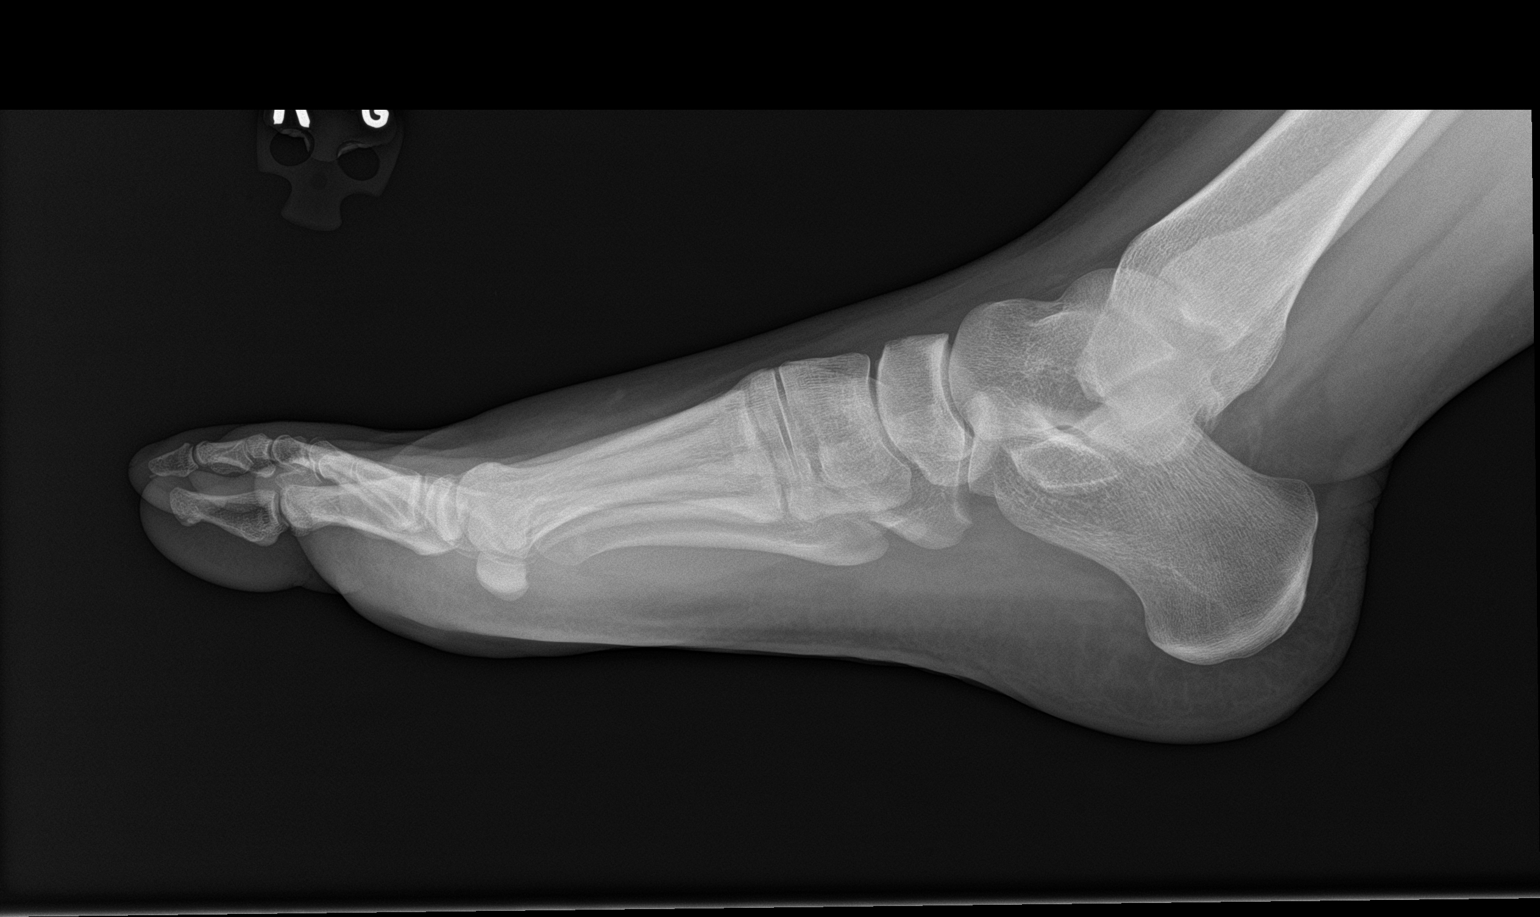

[3 of 3 positions shown; findings below may reference images not displayed]

FINDINGS: There is no evidence of fracture or dislocation. There is no
evidence of arthropathy or other focal bone abnormality. Soft
tissues are unremarkable.
IMPRESSION: No fracture or dislocation.

## 2020-07-17 ENCOUNTER — Other Ambulatory Visit: Payer: Self-pay

## 2020-07-17 ENCOUNTER — Emergency Department (HOSPITAL_COMMUNITY)
Admission: EM | Admit: 2020-07-17 | Discharge: 2020-07-18 | Discharge status: Against Medical Advice | Payer: Commercial Managed Care - PPO
